# Patient Record
Sex: Female | Born: 1995 | Race: Black or African American | Hispanic: No | Marital: Single | Smoking: Never smoker
Health system: Southern US, Community
[De-identification: ages and names within clinical notes are randomized; demographics above are authoritative.]

## PROBLEM LIST (undated history)

## (undated) ENCOUNTER — Inpatient Hospital Stay (HOSPITAL_COMMUNITY): Payer: Self-pay

## (undated) DIAGNOSIS — J45909 Unspecified asthma, uncomplicated: Secondary | ICD-10-CM

## (undated) HISTORY — PX: NO PAST SURGERIES: SHX2092

---
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
## 2017-01-10 ENCOUNTER — Ambulatory Visit (INDEPENDENT_AMBULATORY_CARE_PROVIDER_SITE_OTHER): Payer: Managed Care, Other (non HMO) | Admitting: *Deleted

## 2017-01-10 VITALS — BP 114/61 | HR 71 | Wt 151.0 lb

## 2017-01-10 DIAGNOSIS — Z3042 Encounter for surveillance of injectable contraceptive: Secondary | ICD-10-CM

## 2017-01-10 DIAGNOSIS — Z23 Encounter for immunization: Secondary | ICD-10-CM | POA: Diagnosis not present

## 2017-01-10 DIAGNOSIS — Z3202 Encounter for pregnancy test, result negative: Secondary | ICD-10-CM

## 2017-01-10 LAB — POCT URINE PREGNANCY: Preg Test, Ur: NEGATIVE

## 2017-01-10 MED ORDER — MEDROXYPROGESTERONE ACETATE 150 MG/ML IM SUSP
150.0000 mg | Freq: Once | INTRAMUSCULAR | Status: AC
Start: 2017-01-10 — End: 2017-01-10
  Administered 2017-01-10: 150 mg via INTRAMUSCULAR

## 2017-01-10 NOTE — Progress Notes (Signed)
Pt is in office for depo injection.  Pt is late for injection today. Pt supplied depo for today's visit. Pt states LMP 12-31-16. No intercourse since cycle. Pt also request Flu vaccine today. Pt had UPT in office today, results Negative. Reviewed with Dr Adrian BlackwaterStinson, approved to give depo and Flu today.  Pt advised to RTO 1/18-2/2 for next depo.  Administrations This Visit    medroxyPROGESTERone (DEPO-PROVERA) injection 150 mg    Admin Date 01/10/2017 Action Given Dose 150 mg Route Intramuscular Administered By Lanney GinsFoster, Atasha Colebank D, CMA

## 2017-03-18 ENCOUNTER — Encounter: Payer: Self-pay | Admitting: Family Medicine

## 2017-03-20 ENCOUNTER — Other Ambulatory Visit: Payer: Managed Care, Other (non HMO)

## 2017-04-03 ENCOUNTER — Ambulatory Visit (INDEPENDENT_AMBULATORY_CARE_PROVIDER_SITE_OTHER): Payer: Medicaid Other

## 2017-04-03 DIAGNOSIS — Z3042 Encounter for surveillance of injectable contraceptive: Secondary | ICD-10-CM | POA: Diagnosis not present

## 2017-04-03 NOTE — Progress Notes (Signed)
Patient present for Depo Provera injection. Patient complaining that she has started bleeding the week before she gets her injection. Scheduled the next Depo provera to be done at 11 weeks to see if we can prevent her bleeding. Armandina StammerJennifer Holli Rengel RNBSN

## 2017-04-16 ENCOUNTER — Encounter: Payer: Self-pay | Admitting: Family Medicine

## 2017-04-17 MED ORDER — MEDROXYPROGESTERONE ACETATE 10 MG PO TABS: 10.0000 mg | ORAL_TABLET | Freq: Every day | ORAL | 2 refills | Status: DC

## 2017-05-08 ENCOUNTER — Encounter: Payer: Self-pay | Admitting: Family Medicine

## 2017-05-09 ENCOUNTER — Encounter: Payer: Self-pay | Admitting: Family Medicine

## 2017-05-12 MED ORDER — MEDROXYPROGESTERONE ACETATE 10 MG PO TABS: 20.0000 mg | ORAL_TABLET | Freq: Every day | ORAL | 2 refills | Status: AC

## 2017-05-21 ENCOUNTER — Encounter (HOSPITAL_BASED_OUTPATIENT_CLINIC_OR_DEPARTMENT_OTHER): Payer: Self-pay | Admitting: Emergency Medicine

## 2017-05-21 ENCOUNTER — Other Ambulatory Visit: Payer: Self-pay

## 2017-05-21 ENCOUNTER — Emergency Department (HOSPITAL_BASED_OUTPATIENT_CLINIC_OR_DEPARTMENT_OTHER)
Admission: EM | Admit: 2017-05-21 | Discharge: 2017-05-21 | Disposition: A | Discharge status: Home / Self Care | Payer: Self-pay | Attending: Emergency Medicine | Admitting: Emergency Medicine

## 2017-05-21 DIAGNOSIS — J45909 Unspecified asthma, uncomplicated: Secondary | ICD-10-CM | POA: Insufficient documentation

## 2017-05-21 DIAGNOSIS — J020 Streptococcal pharyngitis: Secondary | ICD-10-CM | POA: Insufficient documentation

## 2017-05-21 LAB — RAPID STREP SCREEN (MED CTR MEBANE ONLY): Streptococcus, Group A Screen (Direct): POSITIVE — AB

## 2017-05-21 MED ORDER — DEXAMETHASONE SODIUM PHOSPHATE 10 MG/ML IJ SOLN
10.0000 mg | Freq: Once | INTRAMUSCULAR | Status: DC
  Filled 2017-05-21: qty 1

## 2017-05-21 MED ORDER — AMOXICILLIN 500 MG PO CAPS: 500.0000 mg | ORAL_CAPSULE | Freq: Two times a day (BID) | ORAL | 0 refills | Status: DC

## 2017-05-21 MED ORDER — PENICILLIN G BENZATHINE 1200000 UNIT/2ML IM SUSP
1.2000 10*6.[IU] | Freq: Once | INTRAMUSCULAR | Status: DC
  Filled 2017-05-21: qty 2

## 2017-05-21 MED FILL — AMOXICILLIN 500 MG CAPSULE: 500 | 10 days supply | Qty: 20 | Fill #0

## 2017-05-21 NOTE — ED Provider Notes (Signed)
[facility] EMERGENCY DEPARTMENT Provider Note   CSN: [id number] Arrival date & time: 05/21/17  7829     History   Chief Complaint Chief Complaint  Patient presents with  . Sore Throat    HPI Erin Bray is a 22 y.o. female with a hx of asthma who presents to the ED with complaint of sore throat for the past 3 days. Patient states pain is progressively worsening, rates it an 8/10 at worst. States it is worse with swallowing (she is able to swallow), no alleviating factors. Tried Tylenol last night and this AM without relief. Saw PCP yesterday for this- recommended tylenol/motrin, no strep swab performed. Denies fever, chills, congestion, or cough.   HPI  Past Medical History:  Diagnosis Date  . Asthma     There are no active problems to display for this patient.   Past Surgical History:  Procedure Laterality Date  . NO PAST SURGERIES      OB History    Gravida Para Term Preterm AB Living   1 1 1  0 0 1   SAB TAB Ectopic Multiple Live Births   0 0 0 0 1       Home Medications    Prior to Admission medications   Medication Sig Start Date End Date Taking? Authorizing Provider  cyclobenzaprine (FLEXERIL) 10 MG tablet Take 1 tablet (10 mg total) by mouth 2 (two) times daily as needed for muscle spasms. 06/27/16   Maxwell Caul, PA-C  ibuprofen (ADVIL,MOTRIN) 600 MG tablet Take 1 tablet (600 mg total) by mouth every 6 (six) hours as needed. 06/27/16   Maxwell Caul, PA-C  medroxyPROGESTERone (DEPO-PROVERA) 150 MG/ML injection Inject 1 mL (150 mg total) into the muscle once. 11/26/16 11/26/16  Levie Heritage, DO  medroxyPROGESTERone (PROVERA) 10 MG tablet Take 2 tablets (20 mg total) by mouth daily. 05/12/17   Levie Heritage, DO  naproxen (NAPROSYN) 500 MG tablet Take 1 tablet (500 mg total) by mouth 2 (two) times daily. 11/19/16   Renne Crigler, PA-C  albuterol (PROVENTIL HFA;VENTOLIN HFA) 108 (90 BASE) MCG/ACT inhaler Inhale 2 puffs into the lungs every  4 (four) hours as needed for wheezing or shortness of breath (cough , with spacer). 01/27/12 09/17/15  Doug Sou, MD    Family History Family History  Problem Relation Age of Onset  . Hypertension Mother   . Heart disease Father   . Cancer Neg Hx   . Diabetes Neg Hx     Social History Social History   Tobacco Use  . Smoking status: Never Smoker  . Smokeless tobacco: Never Used  Substance Use Topics  . Alcohol use: No  . Drug use: No     Allergies   Patient has no known allergies.   Review of Systems Review of Systems  Constitutional: Negative for chills and fever.  HENT: Positive for sore throat and trouble swallowing (painful, but able). Negative for congestion, ear pain and rhinorrhea.   Respiratory: Negative for cough and shortness of breath.   Cardiovascular: Negative for chest pain.     Physical Exam Updated Vital Signs BP 137/82 (BP Location: Right Arm)   Pulse 98   Temp 99.2 F (37.3 C) (Oral)   Resp 18   Ht 5\' 2"  (1.575 m)   Wt 68 kg (150 lb)   LMP 04/23/2017 (Approximate)   SpO2 98%   BMI 27.44 kg/m   Physical Exam  Constitutional: She appears well-developed and well-nourished.  Non-toxic appearance.  No distress.  HENT:  Head: Normocephalic and atraumatic.  Right Ear: Tympanic membrane is not perforated, not erythematous, not retracted and not bulging.  Left Ear: Tympanic membrane is not perforated, not erythematous, not retracted and not bulging.  Nose: Nose normal.  Mouth/Throat: Uvula is midline. Posterior oropharyngeal erythema present. No oropharyngeal exudate. Tonsils are 2+ on the right. Tonsils are 2+ on the left.  Patient is tolerating her own secretions without difficulty. No hot potato voice. No trismus. No drooling. Submandibular compartment is soft.   Eyes: Conjunctivae are normal. Pupils are equal, round, and reactive to light. Right eye exhibits no discharge. Left eye exhibits no discharge.  Neck: Normal range of motion. Neck  supple.  Cardiovascular: Normal rate and regular rhythm.  No murmur heard. Pulmonary/Chest: Breath sounds normal. No respiratory distress. She has no wheezes. She has no rales.  Abdominal: Soft. She exhibits no distension. There is no tenderness.  Lymphadenopathy:    She has no cervical adenopathy.  Neurological: She is alert.  Skin: Skin is warm and dry. No rash noted.  Psychiatric: She has a normal mood and affect. Her behavior is normal.  Nursing note and vitals reviewed.   ED Treatments / Results  Labs (all labs ordered are listed, but only abnormal results are displayed) Labs Reviewed  RAPID STREP SCREEN (NOT AT [facility]) - Abnormal; Notable for the following components:      Result Value   Streptococcus, Group A Screen (Direct) POSITIVE (*)    All other components within normal limits    EKG  EKG Interpretation None       Radiology No results found.  Procedures Procedures (including critical care time)  Medications Ordered in ED Medications  penicillin g benzathine (BICILLIN LA) 1200000 UNIT/2ML injection 1.2 Million Units (1.2 Million Units Intramuscular Not Given 05/21/17 1037)  dexamethasone (DECADRON) injection 10 mg (10 mg Intramuscular Not Given 05/21/17 1037)     Initial Impression / Assessment and Plan / ED Course  I have reviewed the triage vital signs and the nursing notes.  Pertinent labs & imaging results that were available during my care of the patient were reviewed by me and considered in my medical decision making (see chart for details).    Presents with complaint of sore throat.  Patient is nontoxic-appearing, vitals are within normal limits. Rapid strep test is positive. Exam non concerning for PTA or RPA, there is no trismus, uvular deviation, or hot potato voice. Patient is tolerating her own secretions without difficulty, full ROM of the neck, submandibular compartment is soft. Initially discussed with patient plan for IM penicillin and decadron  to which she was agreeable- RN informed me patient changed her mind and did not want a shot, will provide prescription for Amoxicillin. Recommended use of Tylenol and Ibuprofen for any continued discomfort or fevers. I discussed results, treatment plan, need for PCP follow-up, and return precautions with the patient. Provided opportunity for questions, patient confirmed understanding and is in agreement with plan.   Final Clinical Impressions(s) / ED Diagnoses   Final diagnoses:  Strep throat    ED Discharge Orders        Ordered    amoxicillin (AMOXIL) 500 MG capsule  2 times daily     05/21/17 [address]1041       Orissa Arreaga, IrvingtonSamantha R, PA-C 05/21/17 1119    Benjiman CorePickering, Nathan, MD 05/22/17 1635

## 2017-05-21 NOTE — Discharge Instructions (Signed)
You were seen in the emergency department and diagnosed with strep throat.  We have given you amoxicillin- this is an antibiotic to treat the bacterial infection. Please take all of your antibiotics until finished. You may develop abdominal discomfort or diarrhea from the antibiotic.  You may help offset this with probiotics which you can buy at the store (ask your pharmacist if unable to find) or get probiotics in the form of eating yogurt. Do not eat or take the probiotics until 2 hours after your antibiotic. If you are unable to tolerate these side effects follow-up with your primary care provider or return to the emergency department.   If you begin to experience any blistering, rashes, swelling, or difficulty breathing seek medical care for evaluation of potentially more serious side effects.   Please be aware that this medication may interact with other medications you are taking, please be sure to discuss your medication list with your pharmacist. If you are taking birth control the antibiotic will deactivate your birth control for 2 weeks.  You should gradually feel better over the next few days. Take Tylenol and Ibuprofen for fever and pain. Follow up with your primary care provider in the next 1 week if you are not feeling better, if you do not have a primary care provider one is provided in your discharge instructions. Return to the emergency department for any new or worsening symptoms including but not limited to inability to open your mouth, inability to move your neck, worsening pain, change in your voice, inability to swallow your own saliva, drooling, or any other concerns.

## 2017-05-21 NOTE — ED Triage Notes (Signed)
Sore throat x3 days.  Denies any other URI sx.

## 2017-06-19 ENCOUNTER — Ambulatory Visit: Payer: Medicaid Other

## 2017-06-20 ENCOUNTER — Ambulatory Visit: Payer: Medicaid Other

## 2017-07-22 ENCOUNTER — Ambulatory Visit: Payer: Medicaid Other | Admitting: Family Medicine

## 2017-07-23 ENCOUNTER — Encounter: Payer: Self-pay | Admitting: Family Medicine

## 2017-07-29 ENCOUNTER — Ambulatory Visit: Payer: Medicaid Other

## 2017-07-29 ENCOUNTER — Ambulatory Visit (INDEPENDENT_AMBULATORY_CARE_PROVIDER_SITE_OTHER): Payer: 59

## 2017-07-29 VITALS — BP 124/78 | HR 78 | Ht 65.0 in

## 2017-07-29 DIAGNOSIS — Z3202 Encounter for pregnancy test, result negative: Secondary | ICD-10-CM

## 2017-07-29 DIAGNOSIS — N912 Amenorrhea, unspecified: Secondary | ICD-10-CM

## 2017-07-29 LAB — POCT URINE PREGNANCY: PREG TEST UR: NEGATIVE

## 2017-07-29 MED ORDER — MEDROXYPROGESTERONE ACETATE 150 MG/ML IM SUSP: 150.0000 mg | INTRAMUSCULAR | 3 refills | Status: AC

## 2017-07-29 NOTE — Progress Notes (Signed)
Patient missed Depo provera shot. Patient presents for pregnancy test and then will return in two weeks for another pregnancy test and depo provera if negative. Armandina Stammer RN

## 2017-08-07 ENCOUNTER — Ambulatory Visit: Payer: Medicaid Other

## 2017-08-07 ENCOUNTER — Ambulatory Visit: Payer: Medicaid Other | Admitting: Family Medicine

## 2017-08-08 ENCOUNTER — Ambulatory Visit: Payer: Medicaid Other | Admitting: Family Medicine

## 2017-08-12 ENCOUNTER — Ambulatory Visit (INDEPENDENT_AMBULATORY_CARE_PROVIDER_SITE_OTHER): Payer: 59 | Admitting: Advanced Practice Midwife

## 2017-08-12 ENCOUNTER — Encounter: Payer: Self-pay | Admitting: Advanced Practice Midwife

## 2017-08-12 VITALS — BP 121/68 | HR 76 | Ht 62.0 in | Wt 176.0 lb

## 2017-08-12 DIAGNOSIS — Z309 Encounter for contraceptive management, unspecified: Secondary | ICD-10-CM | POA: Diagnosis not present

## 2017-08-12 DIAGNOSIS — J45909 Unspecified asthma, uncomplicated: Secondary | ICD-10-CM

## 2017-08-12 DIAGNOSIS — Z3042 Encounter for surveillance of injectable contraceptive: Secondary | ICD-10-CM | POA: Diagnosis not present

## 2017-08-12 DIAGNOSIS — Z01419 Encounter for gynecological examination (general) (routine) without abnormal findings: Secondary | ICD-10-CM

## 2017-08-12 LAB — POCT URINE PREGNANCY: Preg Test, Ur: NEGATIVE

## 2017-08-12 MED FILL — medroxyPROGESTERone ACETATE: 150 | 90 days supply | Qty: 1 | Fill #0

## 2017-08-12 NOTE — Progress Notes (Signed)
Patient present today for annual exam and depo provera injection. Armandina StammerJennifer Howard RN

## 2017-08-12 NOTE — Progress Notes (Signed)
GYNECOLOGY ANNUAL PREVENTATIVE CARE ENCOUNTER NOTE  Subjective:   Erin Bray is a 22 y.o. [id number] female here for a routine annual gynecologic exam.  Current complaints: none.  Asthma has not bothered her in recent years.  States is using condoms consistently.  . Denies concern for  STDs.   Denies abnormal vaginal bleeding, discharge, pelvic pain, problems with intercourse or other gynecologic concerns.    Gynecologic History No LMP recorded. Patient has had an injection. Contraception: Depo-Provera injections Last Pap: never.   Obstetric History OB History  Gravida Para Term Preterm AB Living  1 1 1  0 0 1  SAB TAB Ectopic Multiple Live Births  0 0 0 0 1    # Outcome Date GA Lbr Len/2nd Weight Sex Delivery Anes PTL Lv  1 Term 01/09/16 [id number] 13:53 / 00:07 6 lb 14.4 oz (3.13 kg) F Vag-Spont EPI  LIV    Past Medical History:  Diagnosis Date  . Asthma     Past Surgical History:  Procedure Laterality Date  . NO PAST SURGERIES      Current Outpatient Medications on File Prior to Visit  Medication Sig Dispense Refill  . medroxyPROGESTERone (DEPO-PROVERA) 150 MG/ML injection Inject 1 mL (150 mg total) into the muscle every 3 (three) months. 1 mL 3  . amoxicillin (AMOXIL) 500 MG capsule Take 1 capsule (500 mg total) by mouth 2 (two) times daily. (Patient not taking: Reported on 08/12/2017) 20 capsule 0  . ibuprofen (ADVIL,MOTRIN) 600 MG tablet Take 1 tablet (600 mg total) by mouth every 6 (six) hours as needed. (Patient not taking: Reported on 08/12/2017) 30 tablet 0  . medroxyPROGESTERone (DEPO-PROVERA) 150 MG/ML injection Inject 1 mL (150 mg total) into the muscle once. 1 mL 2  . medroxyPROGESTERone (PROVERA) 10 MG tablet Take 2 tablets (20 mg total) by mouth daily. (Patient not taking: Reported on 08/12/2017) 60 tablet 2  . naproxen (NAPROSYN) 500 MG tablet Take 1 tablet (500 mg total) by mouth 2 (two) times daily. (Patient not taking: Reported on 08/12/2017) 20 tablet 0   Current  Facility-Administered Medications on File Prior to Visit  Medication Dose Route Frequency Provider Last Rate Last Dose  . medroxyPROGESTERone (DEPO-PROVERA) injection 150 mg  150 mg Intramuscular Q90 days Levie HeritageStinson, Jacob J, DO   150 mg at 04/03/17 1008    No Known Allergies  Social History   Socioeconomic History  . Marital status: Single    Spouse name: Not on file  . Number of children: Not on file  . Years of education: Not on file  . Highest education level: Not on file  Occupational History  . Not on file  Social Needs  . Financial resource strain: Not on file  . Food insecurity:    Worry: Not on file    Inability: Not on file  . Transportation needs:    Medical: Not on file    Non-medical: Not on file  Tobacco Use  . Smoking status: Never Smoker  . Smokeless tobacco: Never Used  Substance and Sexual Activity  . Alcohol use: No  . Drug use: No  . Sexual activity: Not on file  Lifestyle  . Physical activity:    Days per week: Not on file    Minutes per session: Not on file  . Stress: Not on file  Relationships  . Social connections:    Talks on phone: Not on file    Gets together: Not on file    Attends religious service: Not  on file    Active member of club or organization: Not on file    Attends meetings of clubs or organizations: Not on file    Relationship status: Not on file  . Intimate partner violence:    Fear of current or ex partner: Not on file    Emotionally abused: Not on file    Physically abused: Not on file    Forced sexual activity: Not on file  Other Topics Concern  . Not on file  Social History Narrative  . Not on file    Family History  Problem Relation Age of Onset  . Hypertension Mother   . Heart disease Father   . Cancer Neg Hx   . Diabetes Neg Hx     The following portions of the patient's history were reviewed and updated as appropriate: allergies, current medications, past family history, past medical history, past social  history, past surgical history and problem list.  Review of Systems Constitutional: negative for chills, fatigue, fevers and malaise Respiratory: negative for asthma and cough Cardiovascular: negative for chest pain, chest pressure/discomfort, claudication and exertional chest pressure/discomfort Gastrointestinal: negative for abdominal pain and change in bowel habits Genitourinary:negative for genital lesions, sexual problems and vaginal discharge   Objective:  BP 121/68   Pulse 76   Ht 5\' 2"  (1.575 m)   Wt 176 lb (79.8 kg)   BMI 32.19 kg/m  CONSTITUTIONAL: Well-developed, well-nourished female in no acute distress.  HENT:  Normocephalic, atraumatic, Oropharynx is clear and moist EYES: Conjunctivae and EOM are normal..  NECK: Normal range of motion, supple, no masses.  Normal thyroid.  SKIN: Skin is warm and dry. No rash noted. Not diaphoretic. No erythema. No pallor. NEUROLOGIC: Alert and oriented to person, place, and time. Normal reflexes, muscle tone coordination. No cranial nerve deficit noted. PSYCHIATRIC: Normal mood and affect. Normal behavior. Normal judgment and thought content. CARDIOVASCULAR: Normal heart rate noted, regular rhythm RESPIRATORY: Clear to auscultation bilaterally. Effort and breath sounds normal, no problems with respiration noted. BREASTS: Symmetric in size. No masses, skin changes, nipple drainage, or lymphadenopathy. ABDOMEN: Soft, normal bowel sounds, no distention noted.  No tenderness, rebound or guarding.  PELVIC: Declines pelvic exam.  No palpable tenderness or masses. MUSCULOSKELETAL: Normal range of motion. No tenderness.  No cyanosis, clubbing, or edema.  2+ distal pulses.   Assessment:  Annual gynecologic examination    Plan:  Routine preventative health maintenance measures emphasized. Rx DepoProvera 150mg  IM q 12 weeks Discussed options for other contraceptive methods. May want to change at some point, so she doesn't have to remember Depo  visits.  List of options provided. Return in 1 year or PRN Please refer to After Visit Summary for other counseling recommendations.

## 2017-08-12 NOTE — Patient Instructions (Signed)
Contraception Choices Contraception, also called birth control, refers to methods or devices that prevent pregnancy. Hormonal methods Contraceptive implant A contraceptive implant is a thin, plastic tube that contains a hormone. It is inserted into the upper part of the arm. It can remain in place for up to 3 years. Progestin-only injections Progestin-only injections are injections of progestin, a synthetic form of the hormone progesterone. They are given every 3 months by a health care provider. Birth control pills Birth control pills are pills that contain hormones that prevent pregnancy. They must be taken once a day, preferably at the same time each day. Birth control patch The birth control patch contains hormones that prevent pregnancy. It is placed on the skin and must be changed once a week for three weeks and removed on the fourth week. A prescription is needed to use this method of contraception. Vaginal ring A vaginal ring contains hormones that prevent pregnancy. It is placed in the vagina for three weeks and removed on the fourth week. After that, the process is repeated with a new ring. A prescription is needed to use this method of contraception. Emergency contraceptive Emergency contraceptives prevent pregnancy after unprotected sex. They come in pill form and can be taken up to 5 days after sex. They work best the sooner they are taken after having sex. Most emergency contraceptives are available without a prescription. This method should not be used as your only form of birth control. Barrier methods Female condom A female condom is a thin sheath that is worn over the penis during sex. Condoms keep sperm from going inside a woman's body. They can be used with a spermicide to increase their effectiveness. They should be disposed after a single use. Female condom A female condom is a soft, loose-fitting sheath that is put into the vagina before sex. The condom keeps sperm from going  inside a woman's body. They should be disposed after a single use. Diaphragm A diaphragm is a soft, dome-shaped barrier. It is inserted into the vagina before sex, along with a spermicide. The diaphragm blocks sperm from entering the uterus, and the spermicide kills sperm. A diaphragm should be left in the vagina for 6-8 hours after sex and removed within 24 hours. A diaphragm is prescribed and fitted by a health care provider. A diaphragm should be replaced every 1-2 years, after giving birth, after gaining more than 15 lb (6.8 kg), and after pelvic surgery. Cervical cap A cervical cap is a round, soft latex or plastic cup that fits over the cervix. It is inserted into the vagina before sex, along with spermicide. It blocks sperm from entering the uterus. The cap should be left in place for 6-8 hours after sex and removed within 48 hours. A cervical cap must be prescribed and fitted by a health care provider. It should be replaced every 2 years. Sponge A sponge is a soft, circular piece of polyurethane foam with spermicide on it. The sponge helps block sperm from entering the uterus, and the spermicide kills sperm. To use it, you make it wet and then insert it into the vagina. It should be inserted before sex, left in for at least 6 hours after sex, and removed and thrown away within 30 hours. Spermicides Spermicides are chemicals that kill or block sperm from entering the cervix and uterus. They can come as a cream, jelly, suppository, foam, or tablet. A spermicide should be inserted into the vagina with an applicator at least 10-15 minutes before   sex to allow time for it to work. The process must be repeated every time you have sex. Spermicides do not require a prescription. Intrauterine contraception Intrauterine device (IUD) An IUD is a T-shaped device that is put in a woman's uterus. There are two types:  Hormone IUD.This type contains progestin, a synthetic form of the hormone progesterone. This  type can stay in place for 3-5 years.  Copper IUD.This type is wrapped in copper wire. It can stay in place for 10 years.  Permanent methods of contraception Female tubal ligation In this method, a woman's fallopian tubes are sealed, tied, or blocked during surgery to prevent eggs from traveling to the uterus. Hysteroscopic sterilization In this method, a small, flexible insert is placed into each fallopian tube. The inserts cause scar tissue to form in the fallopian tubes and block them, so sperm cannot reach an egg. The procedure takes about 3 months to be effective. Another form of birth control must be used during those 3 months. Female sterilization This is a procedure to tie off the tubes that carry sperm (vasectomy). After the procedure, the man can still ejaculate fluid (semen). Natural planning methods Natural family planning In this method, a couple does not have sex on days when the woman could become pregnant. Calendar method This means keeping track of the length of each menstrual cycle, identifying the days when pregnancy can happen, and not having sex on those days. Ovulation method In this method, a couple avoids sex during ovulation. Symptothermal method This method involves not having sex during ovulation. The woman typically checks for ovulation by watching changes in her temperature and in the consistency of cervical mucus. Post-ovulation method In this method, a couple waits to have sex until after ovulation. Summary  Contraception, also called birth control, means methods or devices that prevent pregnancy.  Hormonal methods of contraception include implants, injections, pills, patches, vaginal rings, and emergency contraceptives.  Barrier methods of contraception can include female condoms, female condoms, diaphragms, cervical caps, sponges, and spermicides.  There are two types of IUDs (intrauterine devices). An IUD can be put in a woman's uterus to prevent pregnancy  for 3-5 years.  Permanent sterilization can be done through a procedure for males, females, or both.  Natural family planning methods involve not having sex on days when the woman could become pregnant. This information is not intended to replace advice given to you by your health care provider. Make sure you discuss any questions you have with your health care provider. Document Released: 02/25/2005 Document Revised: 03/30/2016 Document Reviewed: 03/30/2016 Elsevier Interactive Patient Education  2018 [employer]  

## 2017-11-14 ENCOUNTER — Ambulatory Visit: Payer: 59

## 2017-12-10 IMAGING — CR DG ELBOW COMPLETE 3+V*L*
4 series · 4 of 4 positions shown · non-contrast
Comparison: No recent prior .

CLINICAL DATA: Fall 2 days ago.  Hyperextension.  Pain .

EXAM:
LEFT ELBOW - COMPLETE 3+ VIEW

[x elbow joint ap left]
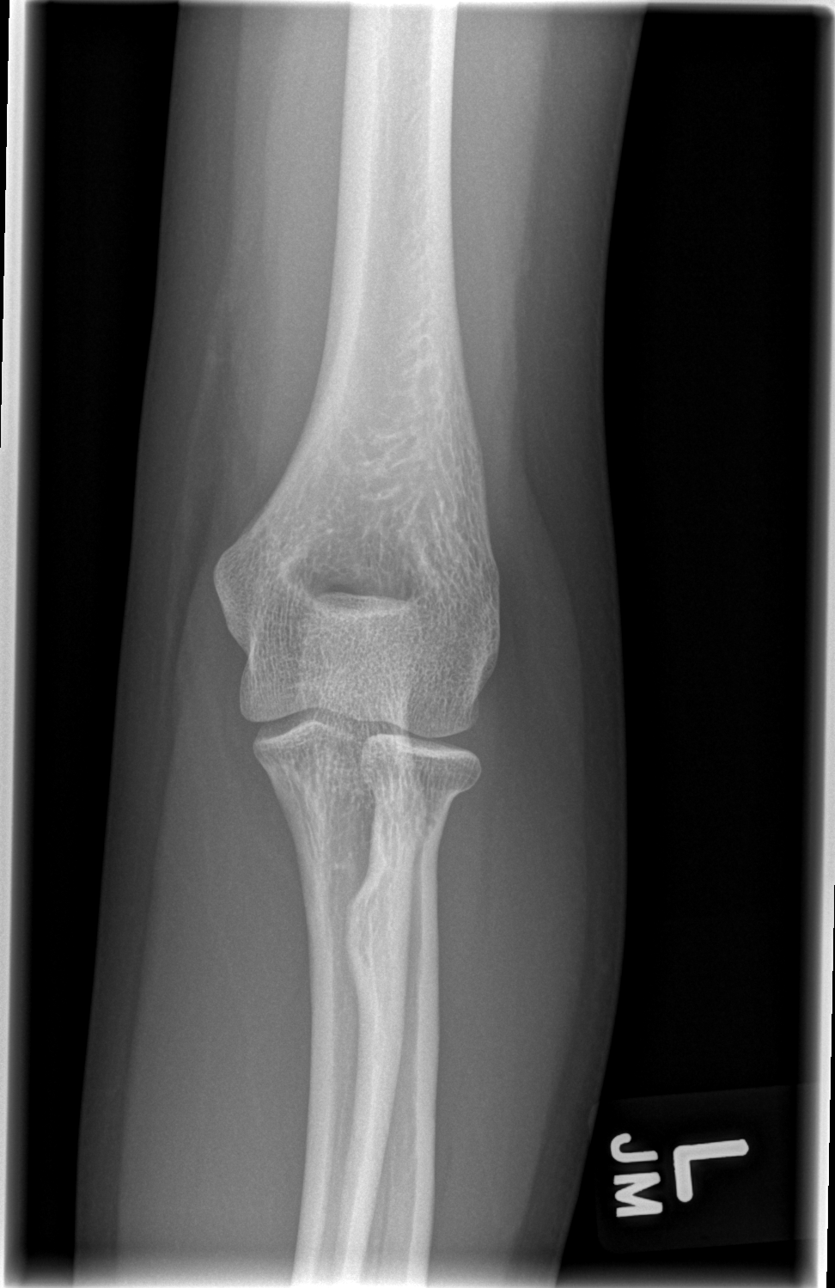

[x elbow joint obl. left (1 of 2)]
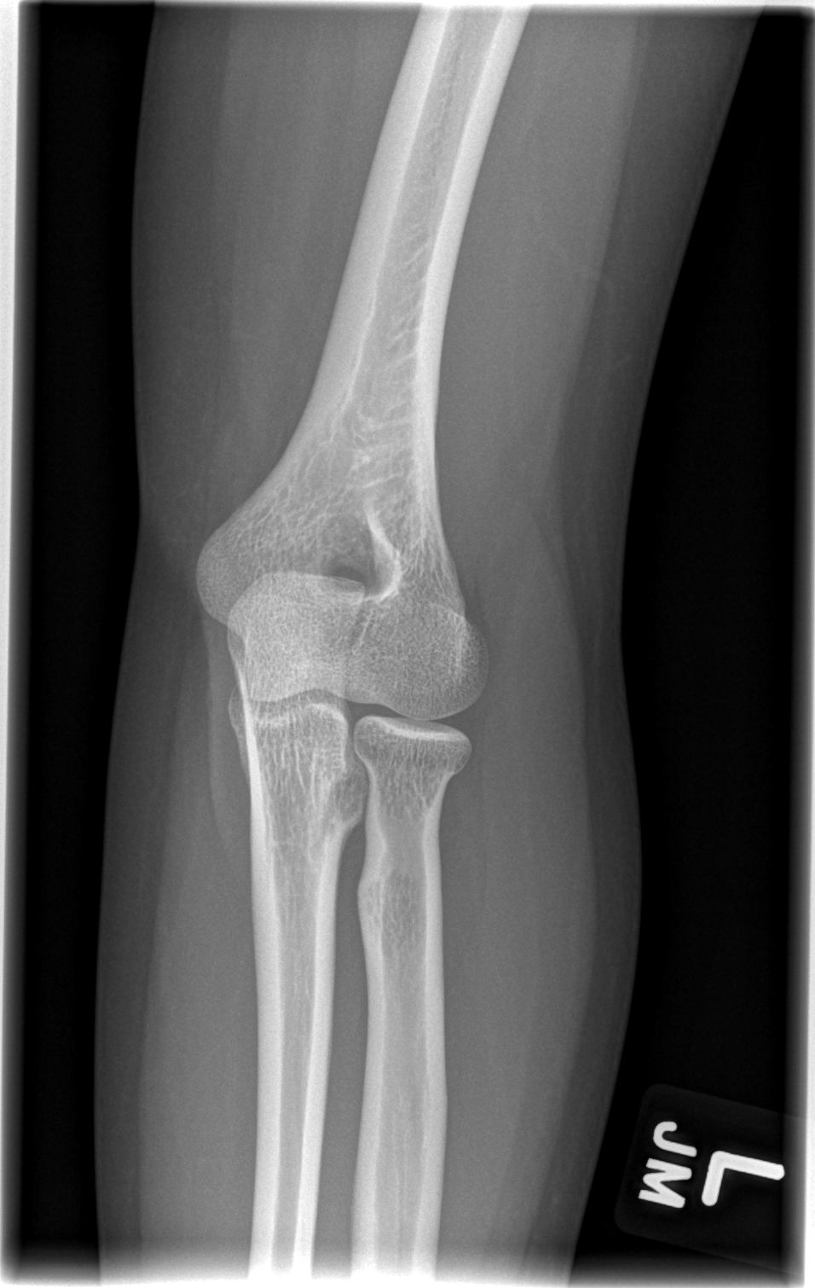

[x elbow joint obl. left (2 of 2)]
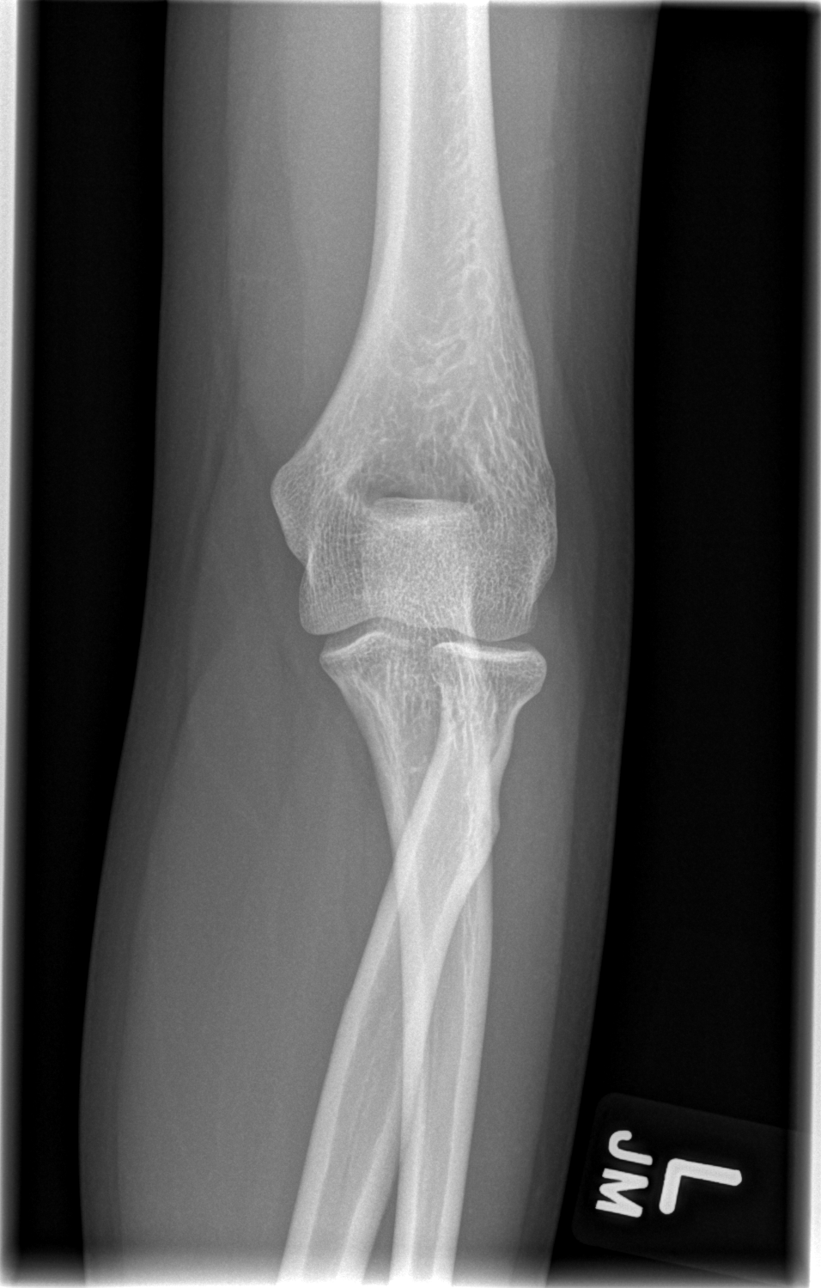

[x elbow joint lat left]
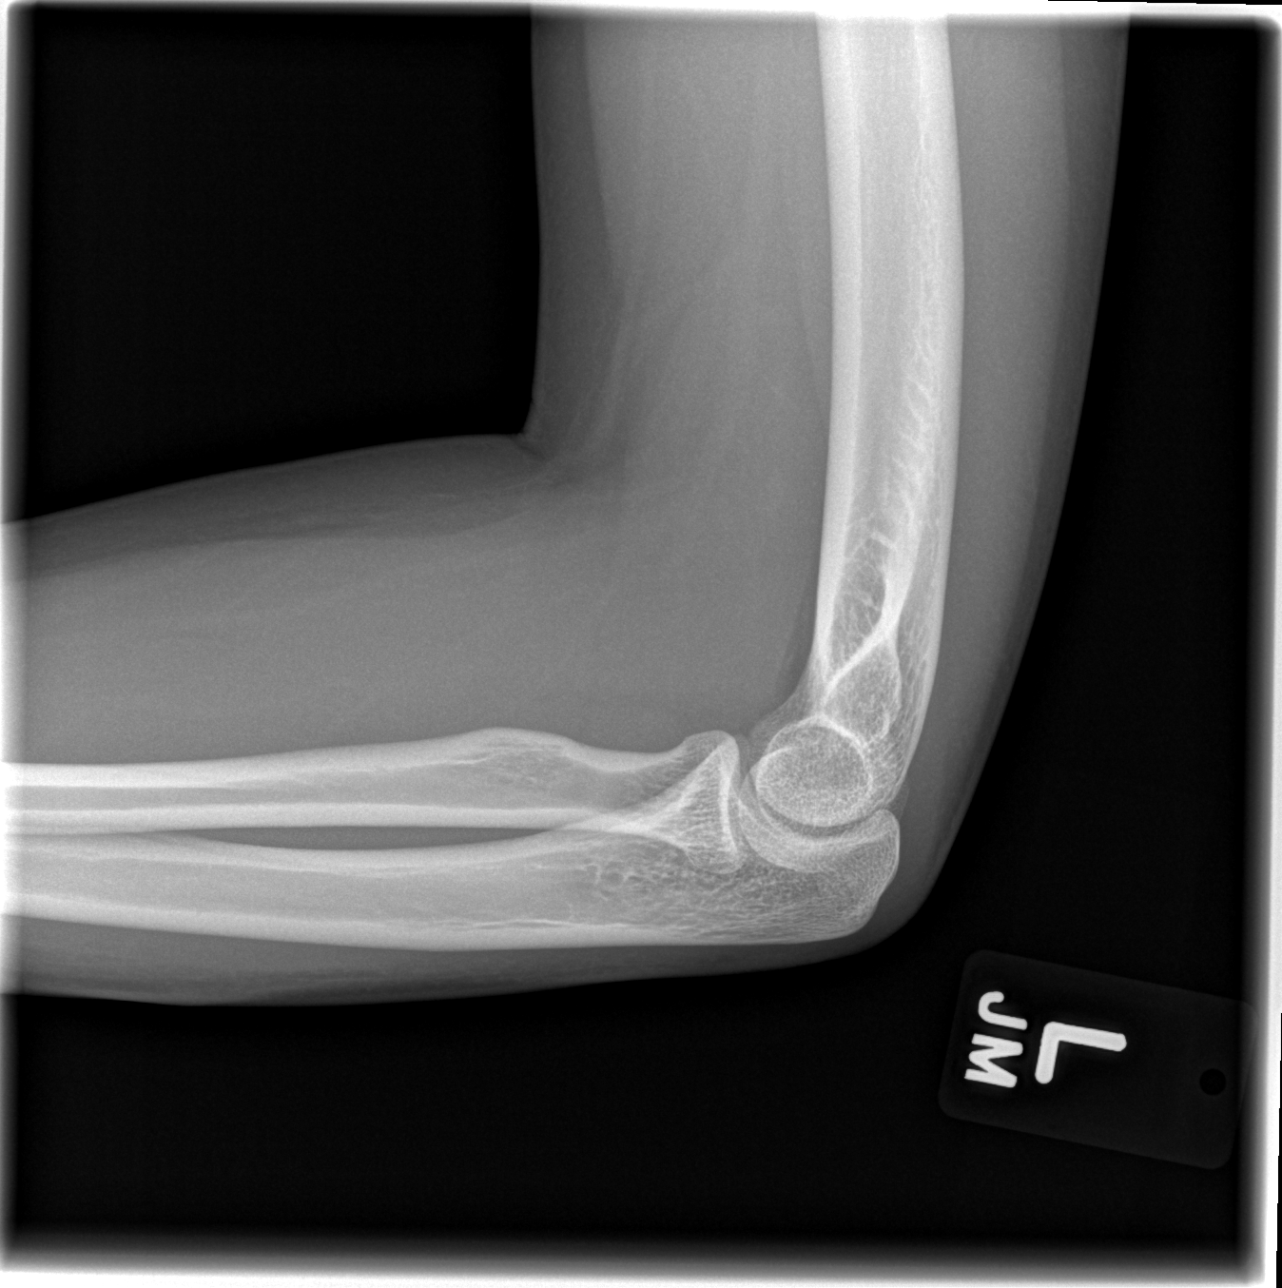

[4 of 4 positions shown; findings below may reference images not displayed]

FINDINGS: No acute bony or joint abnormality identified. No evidence of
fracture or dislocation no focal bony abnormality.
IMPRESSION: No acute or focal abnormality .

## 2018-01-08 ENCOUNTER — Other Ambulatory Visit: Payer: Self-pay

## 2018-01-08 MED ORDER — MEDROXYPROGESTERONE ACETATE 150 MG/ML IM SUSP: 150.0000 mg | Freq: Once | INTRAMUSCULAR | 2 refills | Status: AC

## 2018-01-15 ENCOUNTER — Ambulatory Visit: Payer: 59 | Admitting: Family Medicine

## 2018-01-15 DIAGNOSIS — Z01419 Encounter for gynecological examination (general) (routine) without abnormal findings: Secondary | ICD-10-CM

## 2018-04-21 ENCOUNTER — Other Ambulatory Visit: Payer: 59

## 2023-10-07 ENCOUNTER — Ambulatory Visit: Payer: Self-pay

## 2023-11-11 ENCOUNTER — Other Ambulatory Visit: Payer: Self-pay

## 2023-11-11 ENCOUNTER — Ambulatory Visit
Admission: EM | Admit: 2023-11-11 | Discharge: 2023-11-11 | Disposition: A | Discharge status: Home / Self Care | Attending: Internal Medicine | Admitting: Internal Medicine

## 2023-11-11 DIAGNOSIS — H66002 Acute suppurative otitis media without spontaneous rupture of ear drum, left ear: Secondary | ICD-10-CM

## 2023-11-11 DIAGNOSIS — H9202 Otalgia, left ear: Secondary | ICD-10-CM | POA: Diagnosis not present

## 2023-11-11 MED ORDER — AMOXICILLIN 875 MG PO TABS: 875.0000 mg | ORAL_TABLET | Freq: Two times a day (BID) | ORAL | 0 refills | Status: AC

## 2023-11-11 NOTE — ED Triage Notes (Signed)
 C/O left ear pain x four days. Patient states she has had a headache all day. Denies cough or nasal congestion.

## 2023-11-11 NOTE — Discharge Instructions (Signed)
 You have been prescribed Amoxicillin  for your ear infection. This is an antibiotic often used to treat ear infections. Please take as directed. If not allergic, you may use over the counter ibuprofen  or acetaminophen  as directed for pain.

## 2023-11-11 NOTE — ED Provider Notes (Signed)
 [facility]  Note:  This document was prepared using Dragon voice recognition software and may include unintentional dictation errors.  MRN: [id number] DOB: 1995-11-09 DATE: 11/11/23   Subjective:  Chief Complaint:  Chief Complaint  Patient presents with   Otalgia     HPI: Erin Bray is a 28 y.o. female presenting for left otalgia for 4 days. Patient reports both ears were pruritic a few days ago. She has since developed left otalgia. She reports pain in her left jaw as well. She states she developed a headache and slight dizziness today. She has not taken anything for her symptoms. Denies fever, nausea/vomiting, sore throat, congestion, cough. Endorses left otalgia, headache. Presents NAD.  Prior to Admission medications   Medication Sig Start Date End Date Taking? Authorizing Provider  medroxyPROGESTERone  (DEPO-PROVERA ) 150 MG/ML injection Inject 1 mL (150 mg total) into the muscle every 3 (three) months. 07/29/17   Stinson, Jacob J, DO  medroxyPROGESTERone  (DEPO-PROVERA ) 150 MG/ML injection Inject 1 mL (150 mg total) into the muscle once for 1 dose. 01/08/18 01/08/18  Stinson, Jacob J, DO  medroxyPROGESTERone  (PROVERA ) 10 MG tablet Take 2 tablets (20 mg total) by mouth daily. Patient not taking: Reported on 08/12/2017 05/12/17   Stinson, Jacob J, DO     No Known Allergies  History:   Past Medical History:  Diagnosis Date   Asthma      Past Surgical History:  Procedure Laterality Date   NO PAST SURGERIES      Family History  Problem Relation Age of Onset   Hypertension Mother    Heart disease Father    Cancer Neg Hx    Diabetes Neg Hx     Social History   Tobacco Use   Smoking status: Never   Smokeless tobacco: Never  Substance Use Topics   Alcohol use: No   Drug use: No    Review of Systems  Constitutional:  Negative for fever.  HENT:  Positive for ear pain and hearing loss. Negative for congestion, ear discharge, rhinorrhea and sore throat.    Respiratory:  Negative for cough.   Gastrointestinal:  Negative for abdominal pain, nausea and vomiting.  Neurological:  Positive for dizziness and headaches.     Objective:   Vitals: BP 135/72 (BP Location: Right Arm)   Pulse 92   Temp 98.7 F (37.1 C) (Oral)   Resp 16   LMP 10/10/2023 (Approximate)   SpO2 97%   Physical Exam Constitutional:      General: She is not in acute distress.    Appearance: Normal appearance. She is well-developed and overweight. She is not ill-appearing or toxic-appearing.  HENT:     Head: Normocephalic and atraumatic.     Right Ear: Ear canal normal. A middle ear effusion is present.     Left Ear: Ear canal normal. Tenderness present. A middle ear effusion is present. Tympanic membrane is erythematous and bulging.     Ears:     Comments: Left TM bulging and erythematous with opacified fluid noted behind the TM.  Tenderness to palpation. Cardiovascular:     Rate and Rhythm: Normal rate and regular rhythm.     Heart sounds: Normal heart sounds.  Pulmonary:     Effort: Pulmonary effort is normal.     Breath sounds: Normal breath sounds.     Comments: Clear to auscultation bilaterally  Abdominal:     General: Bowel sounds are normal.     Palpations: Abdomen is soft.  Tenderness: There is no abdominal tenderness.  Skin:    General: Skin is warm and dry.  Neurological:     General: No focal deficit present.     Mental Status: She is alert.  Psychiatric:        Mood and Affect: Mood and affect normal.     Results:  Labs: No results found for this or any previous visit (from the past 24 hours).  Radiology: No results found.   UC Course/Treatments:  Procedures: Procedures   Medications Ordered in UC: Medications - No data to display   Assessment and Plan :     ICD-10-CM   1. Non-recurrent acute suppurative otitis media of left ear without spontaneous rupture of tympanic membrane  H66.002     2. Acute otalgia, left  H92.02        Non-recurrent acute suppurative otitis media of left ear without spontaneous rupture of tympanic membrane Acute otalgia, left Afebrile, nontoxic-appearing, NAD. VSS. DDX includes but not limited to: otitis media, otitis externa, eustachian tube dysfunction, cerumen impaction Left TM bulging and erythematous with opacified fluid. Amoxicillin  875mg  BID prescribed for otitis media. Recommend OTC analgesics as needed for pain. Patient declined Toradol  injection at this time. Strict ED precautions were given and patient verbalized understanding.  ED Discharge Orders          Ordered    amoxicillin  (AMOXIL ) 875 MG tablet  Every 12 hours        11/11/23 1813             PDMP not reviewed this encounter.     Basilia Ulanda SQUIBB, PA-C 11/11/23 1818

## 2023-11-12 ENCOUNTER — Telehealth: Payer: Self-pay

## 2023-11-12 NOTE — Telephone Encounter (Signed)
 Patient's was called to follow up from the patient's previous visit yesterday. The patient was left a voicemail to return the call to answer follow up questions.
